# Patient Record
Sex: Male | Born: 1985 | Race: Black or African American | Hispanic: No | Marital: Single | Smoking: Current every day smoker
Health system: Southern US, Community
[De-identification: ages and names within clinical notes are randomized; demographics above are authoritative.]

## PROBLEM LIST (undated history)

## (undated) DIAGNOSIS — I10 Essential (primary) hypertension: Secondary | ICD-10-CM

---
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
## 2007-08-30 ENCOUNTER — Emergency Department (HOSPITAL_COMMUNITY): Admission: EM | Admit: 2007-08-30 | Discharge: 2007-08-30 | Payer: Self-pay | Admitting: Emergency Medicine

## 2008-12-09 ENCOUNTER — Emergency Department (HOSPITAL_COMMUNITY): Admission: EM | Admit: 2008-12-09 | Discharge: 2008-12-09 | Payer: Self-pay | Admitting: Emergency Medicine

## 2012-08-10 ENCOUNTER — Emergency Department (HOSPITAL_COMMUNITY)
Admission: EM | Admit: 2012-08-10 | Discharge: 2012-08-10 | Disposition: A | Discharge status: Home / Self Care | Payer: Self-pay | Attending: Emergency Medicine | Admitting: Emergency Medicine

## 2012-08-10 ENCOUNTER — Encounter (HOSPITAL_COMMUNITY): Payer: Self-pay | Admitting: *Deleted

## 2012-08-10 DIAGNOSIS — R220 Localized swelling, mass and lump, head: Secondary | ICD-10-CM | POA: Insufficient documentation

## 2012-08-10 DIAGNOSIS — F17200 Nicotine dependence, unspecified, uncomplicated: Secondary | ICD-10-CM | POA: Insufficient documentation

## 2012-08-10 DIAGNOSIS — K047 Periapical abscess without sinus: Secondary | ICD-10-CM | POA: Insufficient documentation

## 2012-08-10 MED ORDER — AMOXICILLIN 500 MG PO CAPS: 500.0000 mg | ORAL_CAPSULE | Freq: Three times a day (TID) | ORAL | Status: AC

## 2012-08-10 MED ORDER — HYDROCODONE-ACETAMINOPHEN 5-325 MG PO TABS: 1.0000 | ORAL_TABLET | ORAL | Status: DC | PRN

## 2012-08-10 MED ORDER — IBUPROFEN 600 MG PO TABS: 600.0000 mg | ORAL_TABLET | Freq: Four times a day (QID) | ORAL | Status: DC | PRN

## 2012-08-10 NOTE — ED Notes (Signed)
Lt jaw pain since yesterday, no injury,

## 2012-08-10 NOTE — ED Provider Notes (Signed)
History     CSN: [id number]  Arrival date & time 08/10/12  1725   First MD Initiated Contact with Patient 08/10/12 1749      Chief Complaint  Patient presents with  . Jaw Pain    (Consider location/radiation/quality/duration/timing/severity/associated sxs/prior treatment) HPI Comments: Brett Pierce is a 27 y.o. Male presenting  with a 1 day history of dental pain and gingival swelling.   He denies a history of injury to the tooth involved which is his left lower wisdom tooth which has partially interrupted has recently started to cause pain.  There has been no fevers,  Chills, nausea or vomiting, also no complaint of difficulty swallowing,  Although chewing makes pain worse.  The patient has tried no medications prior to arrival.    The history is provided by the patient.    History reviewed. No pertinent past medical history.  History reviewed. No pertinent past surgical history.  History reviewed. No pertinent family history.  History  Substance Use Topics  . Smoking status: Current Every Day Smoker  . Smokeless tobacco: Not on file  . Alcohol Use: No      Review of Systems  Constitutional: Negative for fever.  HENT: Positive for dental problem. Negative for sore throat, facial swelling, neck pain and neck stiffness.   Respiratory: Negative for shortness of breath.     Allergies  Review of patient's allergies indicates no known allergies.  Home Medications   Current Outpatient Rx  Name  Route  Sig  Dispense  Refill  . amoxicillin (AMOXIL) 500 MG capsule   Oral   Take 1 capsule (500 mg total) by mouth 3 (three) times daily.   30 capsule   0   . HYDROcodone-acetaminophen (NORCO/VICODIN) 5-325 MG per tablet   Oral   Take 1 tablet by mouth every 4 (four) hours as needed for pain.   10 tablet   0   . ibuprofen (ADVIL,MOTRIN) 600 MG tablet   Oral   Take 1 tablet (600 mg total) by mouth every 6 (six) hours as needed for pain.   20 tablet   0     BP  151/86  Pulse 88  Temp(Src) 99 F (37.2 C) (Oral)  Resp 16  Ht 5\' 7"  (1.702 m)  Wt 160 lb (72.576 kg)  BMI 25.05 kg/m2  SpO2 100%  Physical Exam  Constitutional: He is oriented to person, place, and time. He appears well-developed and well-nourished. No distress.  HENT:  Head: Normocephalic and atraumatic. No trismus in the jaw.  Right Ear: Tympanic membrane and external ear normal.  Left Ear: Tympanic membrane and external ear normal.  Mouth/Throat: Oropharynx is clear and moist and mucous membranes are normal. No oral lesions. Dental abscesses present. No edematous.    Eyes: Conjunctivae are normal.  Neck: Normal range of motion. Neck supple.  Cardiovascular: Normal rate and normal heart sounds.   Pulmonary/Chest: Effort normal.  Abdominal: He exhibits no distension.  Musculoskeletal: Normal range of motion.  Lymphadenopathy:    He has no cervical adenopathy.  Neurological: He is alert and oriented to person, place, and time.  Skin: Skin is warm and dry. No erythema.  Psychiatric: He has a normal mood and affect.    ED Course  Procedures (including critical care time)  Labs Reviewed - No data to display No results found.   1. Dental abscess       MDM  Patient was prescribed amoxicillin ibuprofen and hydrocodone.  He was encouraged to  obtain a dentist for further management of his symptoms and he was given referrals for this followup care.  The patient appears reasonably screened and/or stabilized for discharge and I doubt any other medical condition or other [facility] requiring further screening, evaluation, or treatment in the ED at this time prior to discharge.         Burgess Amor, PA-C 08/10/12 1811

## 2012-08-10 NOTE — ED Provider Notes (Signed)
Medical screening examination/treatment/procedure(s) were performed by non-physician practitioner and as supervising physician I was immediately available for consultation/collaboration. Devoria Albe, MD, Armando Gang   Ward Givens, MD 08/10/12 [phone]

## 2015-08-24 ENCOUNTER — Encounter (HOSPITAL_COMMUNITY): Payer: Self-pay | Admitting: *Deleted

## 2015-08-24 ENCOUNTER — Emergency Department (HOSPITAL_COMMUNITY)
Admission: EM | Admit: 2015-08-24 | Discharge: 2015-08-24 | Disposition: A | Discharge status: Home / Self Care | Payer: No Typology Code available for payment source | Attending: Emergency Medicine | Admitting: Emergency Medicine

## 2015-08-24 ENCOUNTER — Emergency Department (HOSPITAL_COMMUNITY): Payer: No Typology Code available for payment source

## 2015-08-24 DIAGNOSIS — F17200 Nicotine dependence, unspecified, uncomplicated: Secondary | ICD-10-CM | POA: Diagnosis not present

## 2015-08-24 DIAGNOSIS — S5011XA Contusion of right forearm, initial encounter: Secondary | ICD-10-CM | POA: Insufficient documentation

## 2015-08-24 DIAGNOSIS — Y999 Unspecified external cause status: Secondary | ICD-10-CM | POA: Diagnosis not present

## 2015-08-24 DIAGNOSIS — Y929 Unspecified place or not applicable: Secondary | ICD-10-CM | POA: Insufficient documentation

## 2015-08-24 DIAGNOSIS — S161XXA Strain of muscle, fascia and tendon at neck level, initial encounter: Secondary | ICD-10-CM | POA: Diagnosis not present

## 2015-08-24 DIAGNOSIS — Y939 Activity, unspecified: Secondary | ICD-10-CM | POA: Insufficient documentation

## 2015-08-24 DIAGNOSIS — S199XXA Unspecified injury of neck, initial encounter: Secondary | ICD-10-CM | POA: Diagnosis present

## 2015-08-24 MED ORDER — METHOCARBAMOL 500 MG PO TABS: 500.0000 mg | ORAL_TABLET | Freq: Three times a day (TID) | ORAL | Status: AC

## 2015-08-24 MED ORDER — HYDROCODONE-ACETAMINOPHEN 5-325 MG PO TABS: 1.0000 | ORAL_TABLET | ORAL | Status: DC | PRN
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
## 2015-08-25 MED FILL — Hydrocodone-Acetaminophen Tab 5-325 MG: ORAL | Qty: 6 | Status: AC

## 2016-01-27 ENCOUNTER — Encounter (HOSPITAL_COMMUNITY): Payer: Self-pay | Admitting: Emergency Medicine

## 2016-01-27 ENCOUNTER — Emergency Department (HOSPITAL_COMMUNITY)
Admission: EM | Admit: 2016-01-27 | Discharge: 2016-01-27 | Disposition: A | Discharge status: Home / Self Care | Payer: Self-pay | Attending: Emergency Medicine | Admitting: Emergency Medicine

## 2016-01-27 DIAGNOSIS — F17200 Nicotine dependence, unspecified, uncomplicated: Secondary | ICD-10-CM | POA: Insufficient documentation

## 2016-01-27 DIAGNOSIS — T63441A Toxic effect of venom of bees, accidental (unintentional), initial encounter: Secondary | ICD-10-CM | POA: Insufficient documentation

## 2016-01-27 DIAGNOSIS — Z91030 Bee allergy status: Secondary | ICD-10-CM

## 2016-01-27 DIAGNOSIS — L509 Urticaria, unspecified: Secondary | ICD-10-CM

## 2016-01-27 DIAGNOSIS — Z791 Long term (current) use of non-steroidal anti-inflammatories (NSAID): Secondary | ICD-10-CM | POA: Insufficient documentation

## 2016-01-27 MED ORDER — FAMOTIDINE 20 MG PO TABS: 20.0000 mg | ORAL_TABLET | Freq: Two times a day (BID) | ORAL | 0 refills | Status: AC

## 2016-01-27 MED ORDER — FAMOTIDINE 20 MG PO TABS
20.0000 mg | ORAL_TABLET | Freq: Once | ORAL | Status: AC
Start: 2016-01-27 — End: 2016-01-27
  Administered 2016-01-27: 20 mg via ORAL
  Filled 2016-01-27: qty 1

## 2016-01-27 MED ORDER — PREDNISONE 10 MG PO TABS: ORAL_TABLET | ORAL | 0 refills | Status: DC

## 2016-01-27 MED ORDER — PREDNISONE 50 MG PO TABS
60.0000 mg | ORAL_TABLET | Freq: Once | ORAL | Status: AC
  Administered 2016-01-27: 60 mg via ORAL
  Filled 2016-01-27: qty 1

## 2016-01-27 MED ORDER — EPINEPHRINE 0.3 MG/0.3ML IJ SOAJ: 0.3000 mg | Freq: Once | INTRAMUSCULAR | 0 refills | Status: AC

## 2016-01-27 NOTE — ED Triage Notes (Signed)
Pt reports he has mult stings from yellow jackets, had started breaking out in a rash but has become better since his arrival.

## 2016-01-27 NOTE — Discharge Instructions (Signed)
Taking Actos of prednisone tomorrow afternoon.  Continue taking Benadryl 50 mg every 6 hours for the next 2 days, you may then back off if your rash has resolved it does not return.  Return here for any worsening or persistent symptoms.

## 2016-01-27 NOTE — ED Notes (Signed)
Patient given discharge instruction, verbalized understand. Patient ambulatory out of the department.  

## 2016-01-27 NOTE — ED Provider Notes (Signed)
[facility]EM[facility] DEPT Provider Note   CSN: [id number] Arrival date & time: 01/27/16  1526     History   Chief Complaint Chief Complaint  Patient presents with  . Insect Bite    HPI Brett Pierce is a 30 y.o. male presenting with generalized erythematous migrating rash which was triggered by 2 yellow jacket Stings occurring approximately 3 hours ago while at work.  He has swelling at his right cheek and also his right ankle the 2 locations he was stung.  He denies shortness of breath, wheezing, mouth and tongue or throat swelling since the event.  He has taken Benadryl 50 mg prior to arriving here and his erythematous rash has improved.  He denies prior similar symptoms, endorsing he has been stung by yellow jackets one time in the past.  With that event he only had localized swelling reaction.  The history is provided by the patient.    History reviewed. No pertinent past medical history.  There are no active problems to display for this patient.   History reviewed. No pertinent surgical history.     Home Medications    Prior to Admission medications   Medication Sig Start Date End Date Taking? Authorizing Provider  EPINEPHrine 0.3 mg/0.3 mL IJ SOAJ injection Inject 0.3 mLs (0.3 mg total) into the muscle once. 01/27/16 01/27/16  Burgess Amor, PA-C  famotidine (PEPCID) 20 MG tablet Take 1 tablet (20 mg total) by mouth 2 (two) times daily. 01/27/16   Burgess Amor, PA-C  HYDROcodone-acetaminophen (NORCO/VICODIN) 5-325 MG tablet Take 1-2 tablets by mouth every 4 (four) hours as needed. 08/24/15   Ivery Quale, PA-C  HYDROcodone-acetaminophen (NORCO/VICODIN) 5-325 MG tablet Take 1 tablet by mouth every 4 (four) hours as needed. 08/24/15   Ivery Quale, PA-C  ibuprofen (ADVIL,MOTRIN) 600 MG tablet Take 1 tablet (600 mg total) by mouth every 6 (six) hours as needed for pain. 08/10/12   Burgess Amor, PA-C  methocarbamol (ROBAXIN) 500 MG tablet Take 1 tablet (500 mg total) by mouth 3 (three)  times daily. 08/24/15   Ivery Quale, PA-C  predniSONE (DELTASONE) 10 MG tablet 6, 5, 4, 3, 2 then 1 tablet by mouth daily for 6 days total. 01/27/16   Burgess Amor, PA-C    Family History History reviewed. No pertinent family history.  Social History Social History  Substance Use Topics  . Smoking status: Current Every Day Smoker    Packs/day: 0.50  . Smokeless tobacco: Never Used  . Alcohol use No     Allergies   Review of patient's allergies indicates no known allergies.   Review of Systems Review of Systems  Constitutional: Negative for chills and fever.  Respiratory: Negative for shortness of breath and wheezing.   Skin: Positive for rash.  Neurological: Negative for numbness.     Physical Exam Updated Vital Signs BP 141/94 (BP Location: Left Arm)   Pulse 75   Temp 98.7 F (37.1 C) (Oral)   Resp 18   Ht 5\' 6"  (1.676 m)   Wt 97.5 kg   SpO2 99%   BMI 34.70 kg/m   Physical Exam  Constitutional: He appears well-developed and well-nourished. No distress.  HENT:  Head: Normocephalic.  Mouth/Throat: Uvula is midline. No uvula swelling.  No oral edema.  Neck: Neck supple.  Cardiovascular: Normal rate.   Pulmonary/Chest: Effort normal. He has no wheezes.  No wheezing or rhonchi.  Musculoskeletal: Normal range of motion. He exhibits no edema.  Skin: Skin is warm and intact. Rash  noted. Rash is urticarial.  Mild edema right cheek area.  There is no induration.  No obvious edema at right ankle.  He has generalized scattered urticarial lesions on neck trunk and arms.     ED Treatments / Results  Labs (all labs ordered are listed, but only abnormal results are displayed) Labs Reviewed - No data to display  EKG  EKG Interpretation None       Radiology No results found.  Procedures Procedures (including critical care time)  Medications Ordered in ED Medications  predniSONE (DELTASONE) tablet 60 mg (60 mg Oral Given 01/27/16 1728)  famotidine (PEPCID)  tablet 20 mg (20 mg Oral Given 01/27/16 1728)     Initial Impression / Assessment and Plan / ED Course  I have reviewed the triage vital signs and the nursing notes.  Pertinent labs & imaging results that were available during my care of the patient were reviewed by me and considered in my medical decision making (see chart for details).  Clinical Course    Pt given prednisone 60 mg , pepcid here.  Advised continued medications including continuing benadryl until sx resolve.  Cool compresses. Prn f/u for any persistent or worsened sx. He was also prescribed epi pen for prn use, explaining rationale and indication for use.    Final Clinical Impressions(s) / ED Diagnoses   Final diagnoses:  Yellow jacket sting allergy  Hives    New Prescriptions New Prescriptions   EPINEPHRINE 0.3 MG/0.3 ML IJ SOAJ INJECTION    Inject 0.3 mLs (0.3 mg total) into the muscle once.   FAMOTIDINE (PEPCID) 20 MG TABLET    Take 1 tablet (20 mg total) by mouth 2 (two) times daily.   PREDNISONE (DELTASONE) 10 MG TABLET    6, 5, 4, 3, 2 then 1 tablet by mouth daily for 6 days total.     Burgess AmorJulie Juanantonio Stolar, PA-C 01/27/16 1736    Burgess AmorJulie Amalee Olsen, PA-C 01/27/16 1737    Canary Brimhristopher J Tegeler, MD 01/28/16 1217
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
## 2019-05-03 ENCOUNTER — Other Ambulatory Visit: Payer: Self-pay

## 2019-05-04 ENCOUNTER — Other Ambulatory Visit: Payer: Self-pay

## 2019-05-04 ENCOUNTER — Ambulatory Visit: Payer: Self-pay | Attending: Internal Medicine

## 2019-05-04 DIAGNOSIS — Z20822 Contact with and (suspected) exposure to covid-19: Secondary | ICD-10-CM | POA: Insufficient documentation

## 2019-05-05 LAB — NOVEL CORONAVIRUS, NAA: SARS-CoV-2, NAA: NOT DETECTED

## 2019-09-29 ENCOUNTER — Ambulatory Visit: Payer: Self-pay | Attending: Internal Medicine

## 2019-09-29 DIAGNOSIS — Z23 Encounter for immunization: Secondary | ICD-10-CM

## 2019-09-29 NOTE — Progress Notes (Signed)
° °  Covid-19 Vaccination Clinic  Name:  Brett Pierce    MRN: [id number] DOB: 01/11/1986  09/29/2019  [name] was observed post Covid-19 immunization for 15 minutes without incident. He was provided with Vaccine Information Sheet and instruction to access the V-Safe system.   [name] was instructed to call 911 with any severe reactions post vaccine:  Difficulty breathing   Swelling of face and throat   A fast heartbeat   A bad rash all over body   Dizziness and weakness   Immunizations Administered    Name Date Dose VIS Date Route   Pfizer COVID-19 Vaccine 09/29/2019 11:06 AM 0.3 mL 06/13/2018 Intramuscular   Manufacturer: [employer], [employer]   Lot: [id number]   NDC: [address]-4

## 2019-10-27 ENCOUNTER — Ambulatory Visit: Payer: Self-pay

## 2019-11-05 ENCOUNTER — Other Ambulatory Visit: Payer: Self-pay

## 2019-11-05 ENCOUNTER — Encounter (HOSPITAL_COMMUNITY): Payer: Self-pay

## 2019-11-05 ENCOUNTER — Emergency Department (HOSPITAL_COMMUNITY)
Admission: EM | Admit: 2019-11-05 | Discharge: 2019-11-05 | Disposition: A | Discharge status: Home / Self Care | Payer: BC Managed Care – PPO | Attending: Emergency Medicine | Admitting: Emergency Medicine

## 2019-11-05 DIAGNOSIS — F17290 Nicotine dependence, other tobacco product, uncomplicated: Secondary | ICD-10-CM | POA: Insufficient documentation

## 2019-11-05 DIAGNOSIS — I10 Essential (primary) hypertension: Secondary | ICD-10-CM | POA: Diagnosis not present

## 2019-11-05 DIAGNOSIS — M545 Low back pain: Secondary | ICD-10-CM | POA: Diagnosis present

## 2019-11-05 DIAGNOSIS — M5432 Sciatica, left side: Secondary | ICD-10-CM | POA: Insufficient documentation

## 2019-11-05 MED ORDER — PREDNISONE 10 MG PO TABS: ORAL_TABLET | ORAL | 0 refills | Status: AC

## 2019-11-05 MED ORDER — CYCLOBENZAPRINE HCL 10 MG PO TABS: 10.0000 mg | ORAL_TABLET | Freq: Three times a day (TID) | ORAL | 0 refills | Status: AC | PRN

## 2019-11-05 MED ORDER — HYDROCODONE-ACETAMINOPHEN 5-325 MG PO TABS: ORAL_TABLET | ORAL | 0 refills | Status: AC

## 2019-11-05 NOTE — ED Triage Notes (Signed)
Pt reports left lower back started hurting at work Saturday. Radiates down to left knee and groin area. Pt reports taking advill

## 2019-11-05 NOTE — Clinical Social Work Note (Signed)
Transition of Care [facility]) - Emergency Department Mini Assessment  Patient Details  Name: HAIDAN NHAN MRN: [id number] Date of Birth: 03/22/1986  [facility] CM/SW Contact:    Ewing Schlein, LCSW Phone Number: 11/05/2019, 10:57 AM  Clinical Narrative: Patient is a 34 year old male who presented to the ED for back pain. Per chart review, patient has [employer] but does not have a PCP. CSW spoke with patient regarding PCP resources. Patient confirmed he does not currently have a PCP and is agreeable to the resource list. CSW provided patient with PCP list that accepts [employer] in [place]. TOC signing off.  ED Mini Assessment: What brought you to the Emergency Department? : Back pain Barriers to Discharge: ED Barriers Resolved Barrier interventions: PCP resource list Means of departure: Car Interventions which prevented an admission or readmission: Other (must enter comment) (PCP resources)  Admission diagnosis:  BACK PAIN There are no problems to display for this patient.  PCP:  Patient, No Pcp Per Pharmacy:   [facility] - [place], [place] - [address] [place] [address] Phone: [phone] Fax: [phone]

## 2019-11-05 NOTE — ED Provider Notes (Signed)
[facility] EMERGENCY DEPARTMENT Provider Note   CSN: [id number] Arrival date & time: 11/05/19  0753     History Chief Complaint  Patient presents with  . Back Pain    Brett Pierce is a 34 y.o. male.  HPI      Brett Pierce is a 34 y.o. male who presents to the Emergency Department complaining of left-sided low back pain for 2 days.  He reports heavy lifting at his job.  States that his pain began Saturday afternoon while walking.  He describes a sharp stabbing pain to his left low back and buttock that radiates down the backside of his left leg to the level of his knee.  Pain is associated with a tingling sensatio.  Symptoms are worse with standing and walking and improve at rest.  He has tried ice, heat, and Advil without relief.  He denies known injury, numbness of the extremity, abdominal pain, urine or bowel changes, fever or chills.   History reviewed. No pertinent past medical history.  There are no problems to display for this patient.   History reviewed. No pertinent surgical history.     No family history on file.  Social History   Tobacco Use  . Smoking status: Current Every Day Smoker    Packs/day: 0.50    Types: Cigars  . Smokeless tobacco: Never Used  . Tobacco comment: black and milds  Substance Use Topics  . Alcohol use: No  . Drug use: Yes    Types: Marijuana    Comment: occasssional    Home Medications Prior to Admission medications   Medication Sig Start Date End Date Taking? Authorizing Provider  famotidine (PEPCID) 20 MG tablet Take 1 tablet (20 mg total) by mouth 2 (two) times daily. 01/27/16   Burgess Amor, PA-C  HYDROcodone-acetaminophen (NORCO/VICODIN) 5-325 MG tablet Take 1-2 tablets by mouth every 4 (four) hours as needed. 08/24/15   Ivery Quale, PA-C  HYDROcodone-acetaminophen (NORCO/VICODIN) 5-325 MG tablet Take 1 tablet by mouth every 4 (four) hours as needed. 08/24/15   Ivery Quale, PA-C  ibuprofen (ADVIL,MOTRIN) 600 MG tablet  Take 1 tablet (600 mg total) by mouth every 6 (six) hours as needed for pain. 08/10/12   Burgess Amor, PA-C  methocarbamol (ROBAXIN) 500 MG tablet Take 1 tablet (500 mg total) by mouth 3 (three) times daily. 08/24/15   Ivery Quale, PA-C  predniSONE (DELTASONE) 10 MG tablet 6, 5, 4, 3, 2 then 1 tablet by mouth daily for 6 days total. 01/27/16   Idol, Raynelle Fanning, PA-C    Allergies    Patient has no known allergies.  Review of Systems   Review of Systems  Constitutional: Negative for fever.  Respiratory: Negative for shortness of breath.   Cardiovascular: Negative for chest pain.  Gastrointestinal: Negative for abdominal pain, constipation, nausea and vomiting.  Genitourinary: Negative for decreased urine volume, difficulty urinating, dysuria, flank pain and hematuria.  Musculoskeletal: Positive for back pain. Negative for joint swelling, neck pain and neck stiffness.  Skin: Negative for rash.  Neurological: Negative for weakness and numbness.    Physical Exam Updated Vital Signs BP (!) 161/109 (BP Location: Left Arm)   Pulse 61   Temp 98 F (36.7 C) (Oral)   Resp 16   Ht 5\' 8"  (1.727 m)   Wt 93 kg   SpO2 100%   BMI 31.17 kg/m   Physical Exam Vitals and nursing note reviewed.  Constitutional:      Appearance: Normal appearance.  HENT:  Head: Atraumatic.     Mouth/Throat:     Mouth: Mucous membranes are moist.  Cardiovascular:     Rate and Rhythm: Normal rate and regular rhythm.  Pulmonary:     Effort: Pulmonary effort is normal.     Breath sounds: Normal breath sounds.  Chest:     Chest wall: No tenderness.  Abdominal:     General: There is no distension.     Palpations: Abdomen is soft.     Tenderness: There is no abdominal tenderness.  Musculoskeletal:        General: Tenderness present. Normal range of motion.     Right lower leg: No edema.     Left lower leg: No edema.     Comments: Tenderness to palpation of the left lower lumbar paraspinal muscles and SI joint  space.  Pain with straight leg raise on the left, reproduced at 20 degrees.  Hip flexors and extensors are intact.  Skin:    General: Skin is warm.     Capillary Refill: Capillary refill takes less than 2 seconds.  Neurological:     General: No focal deficit present.     Mental Status: He is alert.     Sensory: No sensory deficit.     Motor: No weakness.     ED Results / Procedures / Treatments   Labs (all labs ordered are listed, but only abnormal results are displayed) Labs Reviewed - No data to display  EKG None  Radiology No results found.  Procedures Procedures (including critical care time)  Medications Ordered in ED Medications - No data to display  ED Course  I have reviewed the triage vital signs and the nursing notes.  Pertinent labs & imaging results that were available during my care of the patient were reviewed by me and considered in my medical decision making (see chart for details).    MDM Rules/Calculators/A&P                          Patient with left low back pain for 2 days.  Reports heavy lifting at his job.  No focal neuro deficits, ambulatory with a slow but steady gait.  No concerning symptoms for infectious process or cauda equina.  Likely sciatica.  Patient agrees to treatment plan and close outpatient follow-up.  Given resource list for local primary care.  Patient was hypertensive on arrival, no history of hypertension, no associated symptoms.  No further work-up needed at this time.  Agrees to establish primary care and to have blood pressure rechecked.   Final Clinical Impression(s) / ED Diagnoses Final diagnoses:  Sciatica of left side  Hypertension, unspecified type    Rx / DC Orders ED Discharge Orders    None       Pauline Aus, PA-C 11/05/19 1115    Bethann Berkshire, MD 11/06/19 [phone]

## 2019-11-05 NOTE — Discharge Instructions (Signed)
You may continue to alternate ice and heat to your left lower back.  Avoid bending over, twisting or heavy lifting for at least 1 week.  Your blood pressure here was elevated.  This will need to be rechecked.  You have been given a resource list for some local primary care providers, contact one of the providers listed to establish primary care and to have your blood pressure rechecked.  Return to emergency department for any worsening symptoms.

## 2022-08-27 ENCOUNTER — Emergency Department (HOSPITAL_COMMUNITY)
Admission: EM | Admit: 2022-08-27 | Discharge: 2022-08-27 | Disposition: A | Discharge status: Home / Self Care | Payer: Self-pay | Attending: Emergency Medicine | Admitting: Emergency Medicine

## 2022-08-27 ENCOUNTER — Other Ambulatory Visit: Payer: Self-pay

## 2022-08-27 ENCOUNTER — Emergency Department (HOSPITAL_COMMUNITY): Payer: Self-pay

## 2022-08-27 ENCOUNTER — Encounter (HOSPITAL_COMMUNITY): Payer: Self-pay | Admitting: Emergency Medicine

## 2022-08-27 DIAGNOSIS — F17210 Nicotine dependence, cigarettes, uncomplicated: Secondary | ICD-10-CM | POA: Insufficient documentation

## 2022-08-27 DIAGNOSIS — M62831 Muscle spasm of calf: Secondary | ICD-10-CM | POA: Insufficient documentation

## 2022-08-27 MED ORDER — IBUPROFEN 600 MG PO TABS: 600.0000 mg | ORAL_TABLET | Freq: Four times a day (QID) | ORAL | 0 refills | Status: AC | PRN

## 2022-08-27 MED ORDER — IBUPROFEN 800 MG PO TABS
800.0000 mg | ORAL_TABLET | Freq: Once | ORAL | Status: AC
  Administered 2022-08-27: 800 mg via ORAL
  Filled 2022-08-27: qty 1

## 2022-08-27 NOTE — ED Provider Notes (Signed)
[place] EMERGENCY DEPARTMENT AT [facility] Provider Note  CSN: [id number] Arrival date & time: 08/27/22 4132  Chief Complaint(s) Leg Pain  HPI Brett Pierce is a 37 y.o. male without significant past medical history presenting to the emergency department with left calf pain.  He reports the pain began yesterday.  Reports it is worse with walking.  No recent travel or surgeries.  No fevers or chills.  No redness.  No swelling.  No trauma.  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  No shortness of breath or chest pain.   Past Medical History History reviewed. No pertinent past medical history. There are no problems to display for this patient.  Home Medication(s) Prior to Admission medications   Medication Sig Start Date End Date Taking? Authorizing Provider  ibuprofen (ADVIL) 600 MG tablet Take 1 tablet (600 mg total) by mouth every 6 (six) hours as needed. 08/27/22  Yes Lonell Grandchild, MD  cyclobenzaprine (FLEXERIL) 10 MG tablet Take 1 tablet (10 mg total) by mouth 3 (three) times daily as needed. 11/05/19   Triplett, Tammy, PA-C  famotidine (PEPCID) 20 MG tablet Take 1 tablet (20 mg total) by mouth 2 (two) times daily. 01/27/16   Burgess Amor, PA-C  HYDROcodone-acetaminophen (NORCO/VICODIN) 5-325 MG tablet Take one tab po q 4 hrs prn pain 11/05/19   Triplett, Tammy, PA-C  methocarbamol (ROBAXIN) 500 MG tablet Take 1 tablet (500 mg total) by mouth 3 (three) times daily. 08/24/15   Ivery Quale, PA-C  predniSONE (DELTASONE) 10 MG tablet Take 6 tablets day one, 5 tablets day two, 4 tablets day three, 3 tablets day four, 2 tablets day five, then 1 tablet day six 11/05/19   Pauline Aus, PA-C                                                                                                                                    Past Surgical History History reviewed. No pertinent surgical history. Family History History reviewed. No pertinent family  history.  Social History Social History   Tobacco Use   Smoking status: Every Day    Packs/day: .5    Types: Cigars, Cigarettes   Smokeless tobacco: Never   Tobacco comments:    black and milds  Substance Use Topics   Alcohol use: No   Drug use: Yes    Types: Marijuana    Comment: occasssional   Allergies Patient has no known allergies.  Review of Systems Review of Systems  All other systems reviewed and are negative.   Physical Exam Vital Signs  I have reviewed the triage vital signs BP (!) 161/98   Pulse (!) 59   Temp 97.9 F (36.6 C) (Oral)   Resp 16   SpO2 100%  Physical Exam Vitals and nursing note reviewed.  Constitutional:      General: He is not in acute distress.  Appearance: Normal appearance.  HENT:     Head: Normocephalic and atraumatic.     Mouth/Throat:     Mouth: Mucous membranes are moist.  Eyes:     Conjunctiva/sclera: Conjunctivae normal.  Cardiovascular:     Rate and Rhythm: Normal rate.  Pulmonary:     Effort: Pulmonary effort is normal. No respiratory distress.  Abdominal:     General: Abdomen is flat.  Musculoskeletal:     Comments: Left calf tenderness without erythema or swelling.  Skin:    General: Skin is warm and dry.     Capillary Refill: Capillary refill takes less than 2 seconds.  Neurological:     General: No focal deficit present.     Mental Status: He is alert. Mental status is at baseline.  Psychiatric:        Mood and Affect: Mood normal.        Behavior: Behavior normal.     ED Results and Treatments Labs (all labs ordered are listed, but only abnormal results are displayed) Labs Reviewed - No data to display                                                                                                                        Radiology US Venous Img Lower  Left (DVT Study)  Result Date: 08/27/2022 CLINICAL DATA:  Left calf pain. EXAM: LEFT LOWER EXTREMITY VENOUS DOPPLER ULTRASOUND TECHNIQUE: Gray-scale  sonography with graded compression, as well as color Doppler and duplex ultrasound were performed to evaluate the lower extremity deep venous systems from the level of the common femoral vein and including the common femoral, femoral, profunda femoral, popliteal and calf veins including the posterior tibial, peroneal and gastrocnemius veins when visible. The superficial great saphenous vein was also interrogated. Spectral Doppler was utilized to evaluate flow at rest and with distal augmentation maneuvers in the common femoral, femoral and popliteal veins. COMPARISON:  None Available. FINDINGS: Contralateral Common Femoral Vein: Respiratory phasicity is normal and symmetric with the symptomatic side. No evidence of thrombus. Normal compressibility. Common Femoral Vein: No evidence of thrombus. Normal compressibility, respiratory phasicity and response to augmentation. Saphenofemoral Junction: No evidence of thrombus. Normal compressibility and flow on color Doppler imaging. Profunda Femoral Vein: No evidence of thrombus. Normal compressibility and flow on color Doppler imaging. Femoral Vein: No evidence of thrombus. Normal compressibility, respiratory phasicity and response to augmentation. Popliteal Vein: No evidence of thrombus. Normal compressibility, respiratory phasicity and response to augmentation. Calf Veins: No evidence of thrombus. Normal compressibility and flow on color Doppler imaging. Superficial Great Saphenous Vein: No evidence of thrombus. Normal compressibility. Venous Reflux:  None. Other Findings: No evidence of superficial thrombophlebitis or abnormal fluid collection. IMPRESSION: No evidence of left lower extremity deep venous thrombosis. Electronically Signed   By: Irish Lack M.D.   On: 08/27/2022 09:55    Pertinent labs & imaging results that were available during my care of the patient were reviewed by me and considered in my  medical decision making (see MDM for  details).  Medications Ordered in ED Medications  ibuprofen (ADVIL) tablet 800 mg (800 mg Oral Given 08/27/22 0741)                                                                                                                                     Procedures Procedures  (including critical care time)  Medical Decision Making / ED Course   MDM:  37 year old male presenting to the emergency department with left leg pain.  Patient well-appearing, physical exam reassuring without abnormality other than tenderness.  Suspect most likely cause is muscular strain, given tenderness will obtain ultrasound but no risk factors for DVT and no edema or erythema.  Will also treat with Motrin.  No trauma to suggest bony injury and patient has been ambulatory.  Range of motion at the knee and ankle intact, doubt tendon injury. Will re-assess after ultrasound. Anticipate discharge.   Clinical Course as of 08/27/22 1006  Fri Aug 27, 2022  1002 DVT US negative. Will discharge patient to home. All questions answered. Patient comfortable with plan of discharge. Return precautions discussed with patient and specified on the after visit summary.  [WS]    Clinical Course User Index [WS] Lonell Grandchild, MD     Imaging Studies ordered: I ordered imaging studies including DVT US On my interpretation imaging demonstrates no acute process I independently visualized and interpreted imaging. I agree with the radiologist interpretation   Medicines ordered and prescription drug management: Meds ordered this encounter  Medications   ibuprofen (ADVIL) tablet 800 mg   ibuprofen (ADVIL) 600 MG tablet    Sig: Take 1 tablet (600 mg total) by mouth every 6 (six) hours as needed.    Dispense:  30 tablet    Refill:  0    -I have reviewed the patients home medicines and have made adjustments as needed  Social Determinants of Health:  Diagnosis or treatment significantly limited by social determinants of  health: current smoker   Reevaluation: After the interventions noted above, I reevaluated the patient and found that their symptoms have improved  Co morbidities that complicate the patient evaluation History reviewed. No pertinent past medical history.    Dispostion: Disposition decision including need for hospitalization was considered, and patient discharged from emergency department.    Final Clinical Impression(s) / ED Diagnoses Final diagnoses:  Muscle spasm of left calf     This chart was dictated using voice recognition software.  Despite best efforts to proofread,  errors can occur which can change the documentation meaning.    Lonell Grandchild, MD 08/27/22 1006

## 2022-08-27 NOTE — ED Triage Notes (Signed)
Pt c/o left calf pain since yesterday. Worse with standing. No obvious swelling/redness/warmth noted. Nad.

## 2022-08-27 NOTE — Discharge Instructions (Addendum)
We evaluated you for your leg pain.  Your ultrasound was negative for blood clot.  Your symptoms are most likely due to a muscle strain or muscle sprain.  Please take 600 mg of Motrin every 6 hours as needed for pain.  You can also ice and elevate your leg.  Please follow-up with your primary doctor.  If you develop any new symptoms such as rash, fevers or chills, increasing pain or swelling, nausea or vomiting, or other concerning symptoms, please return to the emergency department.

## 2023-10-02 ENCOUNTER — Emergency Department (HOSPITAL_COMMUNITY)
Admission: EM | Admit: 2023-10-02 | Discharge: 2023-10-03 | Disposition: A | Discharge status: Home / Self Care | Payer: Self-pay | Attending: Emergency Medicine | Admitting: Emergency Medicine

## 2023-10-02 ENCOUNTER — Encounter (HOSPITAL_COMMUNITY): Payer: Self-pay

## 2023-10-02 ENCOUNTER — Other Ambulatory Visit: Payer: Self-pay

## 2023-10-02 DIAGNOSIS — S0502XA Injury of conjunctiva and corneal abrasion without foreign body, left eye, initial encounter: Secondary | ICD-10-CM | POA: Insufficient documentation

## 2023-10-02 DIAGNOSIS — W228XXA Striking against or struck by other objects, initial encounter: Secondary | ICD-10-CM | POA: Insufficient documentation

## 2023-10-02 DIAGNOSIS — F17210 Nicotine dependence, cigarettes, uncomplicated: Secondary | ICD-10-CM | POA: Insufficient documentation

## 2023-10-02 DIAGNOSIS — I10 Essential (primary) hypertension: Secondary | ICD-10-CM | POA: Insufficient documentation

## 2023-10-02 HISTORY — DX: Essential (primary) hypertension: I10

## 2023-10-02 MED ORDER — TETRACAINE HCL 0.5 % OP SOLN
2.0000 [drp] | Freq: Once | OPHTHALMIC | Status: DC
  Filled 2023-10-02: qty 4

## 2023-10-02 MED ORDER — FLUORESCEIN SODIUM 1 MG OP STRP
1.0000 | ORAL_STRIP | Freq: Once | OPHTHALMIC | Status: DC
  Filled 2023-10-02: qty 1

## 2023-10-02 NOTE — ED Triage Notes (Signed)
 Pt to Ed from home with c/o injury to left eye, pt was fishing this am and fish lure hit him in left eye, the hook didn't hit eye, no bleeding occurred, pt says vision is now blurry and eye is red.

## 2023-10-03 MED ORDER — TOBRAMYCIN 0.3 % OP OINT: 1.0000 | TOPICAL_OINTMENT | Freq: Four times a day (QID) | OPHTHALMIC | 0 refills | Status: AC

## 2023-10-03 MED ORDER — ERYTHROMYCIN 5 MG/GM OP OINT: 1.0000 | TOPICAL_OINTMENT | Freq: Four times a day (QID) | OPHTHALMIC | 0 refills | Status: DC

## 2023-10-03 MED ORDER — ACETAMINOPHEN 500 MG PO TABS
1000.0000 mg | ORAL_TABLET | Freq: Once | ORAL | Status: AC
  Filled 2023-10-03: qty 2

## 2023-10-03 NOTE — ED Provider Notes (Signed)
 [facility]Y DEPT [facility] Emergency Department Provider Note MRN:  [id number]  Arrival date & time: 10/03/23     Chief Complaint   Eye Injury   History of Present Illness   Brett Pierce is a 38 y.o. year-old male with a history of hypertension presenting to the ED with chief complaint of eye injury.  Patient was fishing today and his lure got caught in the tall grass.  He was pulling it and it suddenly let go and flew back into his face striking him in the left eye.  He explains that it mostly hit his left upper eyelid, it did not stick or get caught in his eye, no bleeding.  He has been experiencing eye discomfort since, thinks maybe his eye got scratched in the process.  Described as a foreign body sensation.  Vision in this eye is a bit blurry, no other injuries.  Review of Systems  A thorough review of systems was obtained and all systems are negative except as noted in the HPI and PMH.   Patient's Health History    Past Medical History:  Diagnosis Date   Hypertension     History reviewed. No pertinent surgical history.  History reviewed. No pertinent family history.  Social History   Socioeconomic History   Marital status: Single    Spouse name: Not on file   Number of children: Not on file   Years of education: Not on file   Highest education level: Not on file  Occupational History   Not on file  Tobacco Use   Smoking status: Every Day    Current packs/day: 0.50    Types: Cigars, Cigarettes   Smokeless tobacco: Never   Tobacco comments:    black and milds  Substance and Sexual Activity   Alcohol use: No   Drug use: Yes    Types: Marijuana    Comment: occasssional   Sexual activity: Not on file  Other Topics Concern   Not on file  Social History Narrative   Not on file   Social Drivers of Health   Financial Resource Strain: Not on file  Food Insecurity: Not on file  Transportation Needs: Not on file  Physical Activity: Not on file   Stress: Not on file  Social Connections: Not on file  Intimate Partner Violence: Not on file     Physical Exam   Vitals:   10/02/23 2052 10/03/23 0000  BP: (!) 182/106 (!) 180/95  Pulse: 75 74  Resp: 18 17  Temp: 98.3 F (36.8 C) 98.3 F (36.8 C)  SpO2: 99% 99%    CONSTITUTIONAL: Well-appearing, NAD NEURO/PSYCH:  Alert and oriented x 3, no focal deficits EYES:  eyes equal and reactive ENT/NECK:  no LAD, no JVD CARDIO: Regular rate, well-perfused, normal S1 and S2 PULM:  CTAB no wheezing or rhonchi GI/GU:  non-distended, non-tender MSK/SPINE:  No gross deformities, no edema SKIN:  no rash, atraumatic   *Additional and/or pertinent findings included in MDM below  Diagnostic and Interventional Summary    EKG Interpretation Date/Time:    Ventricular Rate:    PR Interval:    QRS Duration:    QT Interval:    QTC Calculation:   R Axis:      Text Interpretation:         Labs Reviewed - No data to display  No orders to display    Medications  fluorescein ophthalmic strip 1 strip (has no administration in time range)  tetracaine (PONTOCAINE)  0.5 % ophthalmic solution 2 drop (has no administration in time range)  acetaminophen  (TYLENOL ) tablet 1,000 mg (1,000 mg Oral Given 10/03/23 0018)     Procedures  /  Critical Care Procedures  ED Course and Medical Decision Making  Initial Impression and Ddx Normal extraocular movements, normal pupillary response, some mild erythema to the conjunctiva on the left.  Visual acuity is largely intact.  With tetracaine application his symptoms are largely resolved, with fluorescein application and close visual inspection I see a small corneal abrasion at the 1 or 2 o'clock position.  No evidence of Seidel sign, really nothing to suggest open globe which is lucky given the mechanism.  Past medical/surgical history that increases complexity of ED encounter: None, does not wear contacts  Interpretation of Diagnostics Laboratory  and/or imaging options to aid in the diagnosis/care of the patient were considered.  After careful history and physical examination, it was determined that there was no indication for diagnostics at this time.  Patient Reassessment and Ultimate Disposition/Management     Discharge.  Given the mechanism and the concern for organic matter involved in the corneal abrasion, providing prescription for tobramycin ointment.  Patient management required discussion with the following services or consulting groups:  None  Complexity of Problems Addressed Acute illness or injury that poses threat of life of bodily function  Additional Data Reviewed and Analyzed Further history obtained from: None  Additional Factors Impacting ED Encounter Risk Prescriptions  Merrick Abe. Harless Lien, MD [facility] Emergency Medicine [facility] [email or web address]  Final Clinical Impressions(s) / ED Diagnoses     ICD-10-CM   1. Abrasion of left cornea, initial encounter  [email or web address]       ED Discharge Orders          Ordered    erythromycin ophthalmic ointment  4 times daily,   Status:  Discontinued        10/03/23 0009    erythromycin ophthalmic ointment  4 times daily,   Status:  Discontinued        10/03/23 0009    tobramycin (TOBREX) 0.3 % ophthalmic ointment  4 times daily        10/03/23 0018             Discharge Instructions Discussed with and Provided to Patient:    Discharge Instructions      You were evaluated in the Emergency Department and after careful evaluation, we did not find any emergent condition requiring admission or further testing in the hospital.  Your exam/testing today was overall reassuring.  Symptoms seem to be due to a corneal abrasion.  Use the tobramycin antibiotic ointment as directed.  Recommend follow-up with an eye specialist if not significantly improving over the next 2 or 3 days.  Please return to the Emergency Department if you experience any  worsening of your condition.  Thank you for allowing us  to be a part of your care.       Edson Graces, MD 10/03/23 [phone]

## 2023-10-03 NOTE — Discharge Instructions (Addendum)
 You were evaluated in the Emergency Department and after careful evaluation, we did not find any emergent condition requiring admission or further testing in the hospital.  Your exam/testing today was overall reassuring.  Symptoms seem to be due to a corneal abrasion.  Use the tobramycin antibiotic ointment as directed.  Recommend follow-up with an eye specialist if not significantly improving over the next 2 or 3 days.  Please return to the Emergency Department if you experience any worsening of your condition.  Thank you for allowing us  to be a part of your care.
